# Patient Record
Sex: Female | Born: 1950 | Race: Black or African American | Hispanic: No | Marital: Married
Health system: Southern US, Community
[De-identification: ages and names within clinical notes are randomized; demographics above are authoritative.]

## PROBLEM LIST (undated history)

## (undated) DIAGNOSIS — I10 Essential (primary) hypertension: Secondary | ICD-10-CM

## (undated) DIAGNOSIS — E785 Hyperlipidemia, unspecified: Secondary | ICD-10-CM

## (undated) HISTORY — PX: REDUCTION MAMMAPLASTY: SUR839

## (undated) HISTORY — DX: Hyperlipidemia, unspecified: E78.5

## (undated) HISTORY — DX: Essential (primary) hypertension: I10

---

## 1997-12-26 ENCOUNTER — Ambulatory Visit (HOSPITAL_COMMUNITY): Admission: RE | Admit: 1997-12-26 | Discharge: 1997-12-26 | Payer: Self-pay | Admitting: Family Medicine

## 1998-02-27 ENCOUNTER — Ambulatory Visit: Admission: RE | Admit: 1998-02-27 | Discharge: 1998-02-27 | Payer: Self-pay | Admitting: *Deleted

## 1999-03-25 ENCOUNTER — Encounter: Admission: RE | Admit: 1999-03-25 | Discharge: 1999-03-25 | Payer: Self-pay | Admitting: Family Medicine

## 1999-03-25 ENCOUNTER — Encounter: Payer: Self-pay | Admitting: Family Medicine

## 1999-04-09 ENCOUNTER — Encounter: Admission: RE | Admit: 1999-04-09 | Discharge: 1999-04-09 | Payer: Self-pay | Admitting: Family Medicine

## 1999-04-09 ENCOUNTER — Encounter: Payer: Self-pay | Admitting: Family Medicine

## 1999-10-01 ENCOUNTER — Encounter: Payer: Self-pay | Admitting: Family Medicine

## 1999-10-01 ENCOUNTER — Encounter: Admission: RE | Admit: 1999-10-01 | Discharge: 1999-10-01 | Payer: Self-pay | Admitting: Family Medicine

## 2000-02-22 ENCOUNTER — Other Ambulatory Visit: Admission: RE | Admit: 2000-02-22 | Discharge: 2000-02-22 | Payer: Self-pay | Admitting: Family Medicine

## 2000-04-28 ENCOUNTER — Encounter: Admission: RE | Admit: 2000-04-28 | Discharge: 2000-04-28 | Payer: Self-pay | Admitting: Family Medicine

## 2000-04-28 ENCOUNTER — Encounter: Payer: Self-pay | Admitting: Family Medicine

## 2001-02-22 ENCOUNTER — Other Ambulatory Visit: Admission: RE | Admit: 2001-02-22 | Discharge: 2001-02-22 | Payer: Self-pay | Admitting: Family Medicine

## 2001-05-01 ENCOUNTER — Encounter: Admission: RE | Admit: 2001-05-01 | Discharge: 2001-05-01 | Payer: Self-pay | Admitting: Family Medicine

## 2001-05-01 ENCOUNTER — Encounter: Payer: Self-pay | Admitting: Family Medicine

## 2001-06-05 ENCOUNTER — Ambulatory Visit (HOSPITAL_COMMUNITY): Admission: RE | Admit: 2001-06-05 | Discharge: 2001-06-05 | Payer: Self-pay | Admitting: Obstetrics and Gynecology

## 2001-06-05 ENCOUNTER — Encounter: Payer: Self-pay | Admitting: Obstetrics and Gynecology

## 2001-06-22 ENCOUNTER — Ambulatory Visit (HOSPITAL_COMMUNITY): Admission: RE | Admit: 2001-06-22 | Discharge: 2001-06-22 | Payer: Self-pay | Admitting: Obstetrics and Gynecology

## 2001-06-22 ENCOUNTER — Encounter: Payer: Self-pay | Admitting: Obstetrics and Gynecology

## 2002-04-09 ENCOUNTER — Other Ambulatory Visit: Admission: RE | Admit: 2002-04-09 | Discharge: 2002-04-09 | Payer: Self-pay | Admitting: Family Medicine

## 2002-05-03 ENCOUNTER — Encounter: Payer: Self-pay | Admitting: Family Medicine

## 2002-05-03 ENCOUNTER — Encounter: Admission: RE | Admit: 2002-05-03 | Discharge: 2002-05-03 | Payer: Self-pay | Admitting: Family Medicine

## 2003-04-18 ENCOUNTER — Other Ambulatory Visit: Admission: RE | Admit: 2003-04-18 | Discharge: 2003-04-18 | Payer: Self-pay | Admitting: Family Medicine

## 2003-04-29 ENCOUNTER — Encounter: Admission: RE | Admit: 2003-04-29 | Discharge: 2003-04-29 | Payer: Self-pay | Admitting: Family Medicine

## 2003-06-26 ENCOUNTER — Encounter: Admission: RE | Admit: 2003-06-26 | Discharge: 2003-06-26 | Payer: Self-pay | Admitting: Family Medicine

## 2003-12-29 ENCOUNTER — Emergency Department (HOSPITAL_COMMUNITY): Admission: EM | Admit: 2003-12-29 | Discharge: 2003-12-29 | Payer: Self-pay | Admitting: Emergency Medicine

## 2004-07-02 ENCOUNTER — Encounter: Admission: RE | Admit: 2004-07-02 | Discharge: 2004-07-02 | Payer: Self-pay | Admitting: Family Medicine

## 2004-07-02 ENCOUNTER — Other Ambulatory Visit: Admission: RE | Admit: 2004-07-02 | Discharge: 2004-07-02 | Payer: Self-pay | Admitting: Family Medicine

## 2004-11-05 IMAGING — US US TRANSVAGINAL NON-OB
1 series · 14 of 25 positions shown · non-contrast
Comparison: none

CLINICAL DATA: Follow up known uterine fibroid. 
 TRANSVAGINAL/TRANSABDOMINAL PELVIC ULTRASOUND
 Comparison is made with [HOSPITAL] pelvic ultrasound of 06/05/01.  Since that study progressive diffuse uterine fibroids are seen.  The largest is partially exophytic at the posterior mid uterine segment which transabdominally measures 5.9cm AP x 7.3cm wide x 8.9cm long.  Part of the fundal endometrial stripe is visualized and measures normally at 4mm.  The remaining endometrium is poorly visualized due to diffuse fibroid changes.  The uterus is enlarged and lobulated measuring 15cm long x 10cm AP x 9.1cm wide due to multiple fibroids.  The bilateral ovaries are not identified although no sonographic adnexal masses are seen.  No free fluid is noted.  
 IMPRESSION
   Enlarged      lobulated uterus with progressive multiple uterine fibroids since previous      [HOSPITAL] pelvic ultrasound of 06/05/01 as described. 
  Non      visualization of bilateral ovaries. 
  Otherwise      negative.

[Series 1: unknown · 0.38mm/px · 14 of 41 slices shown]
[im 1/41]
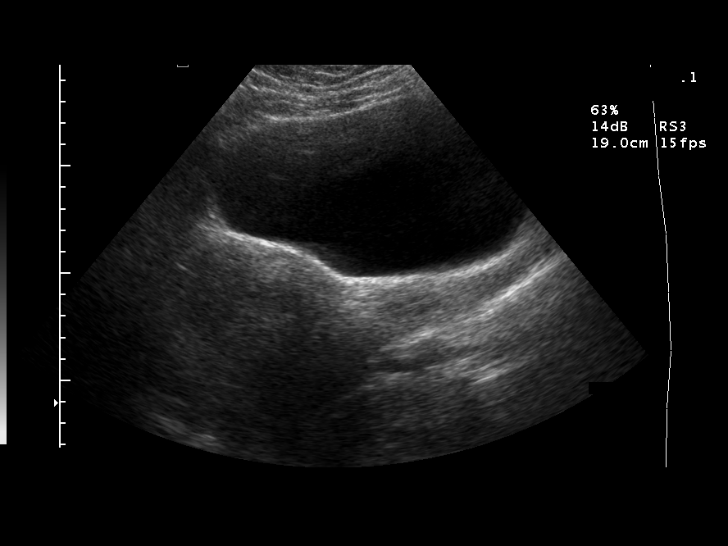
[im 4/41]
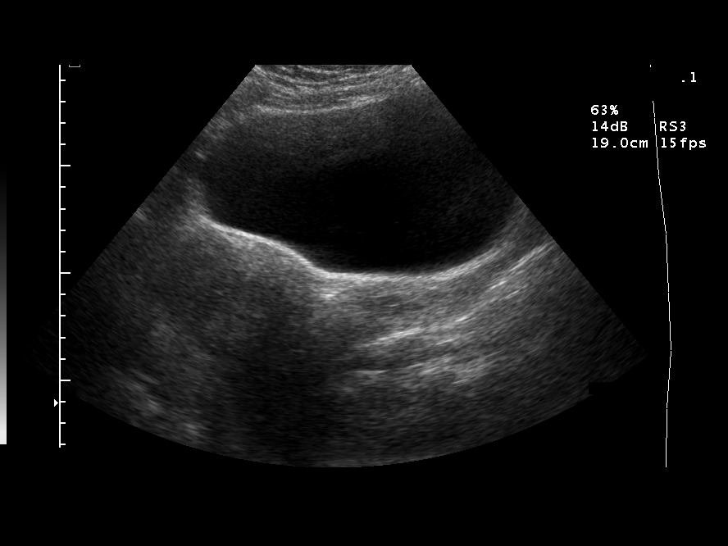
[im 7/41]
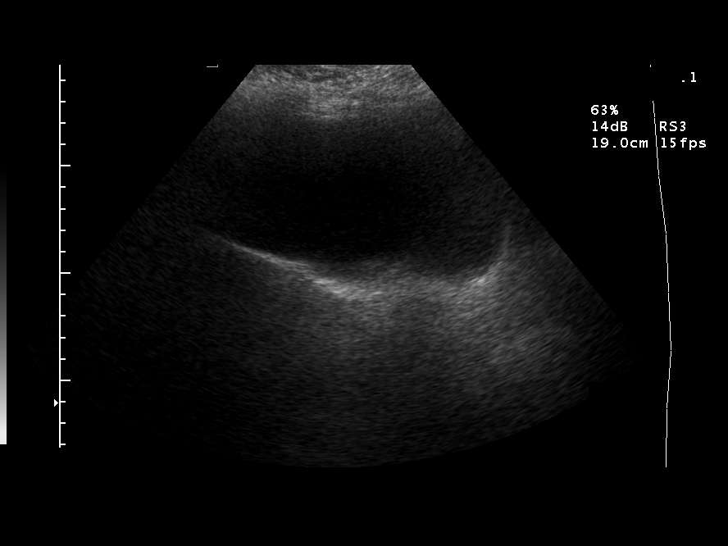
[im 11/41]
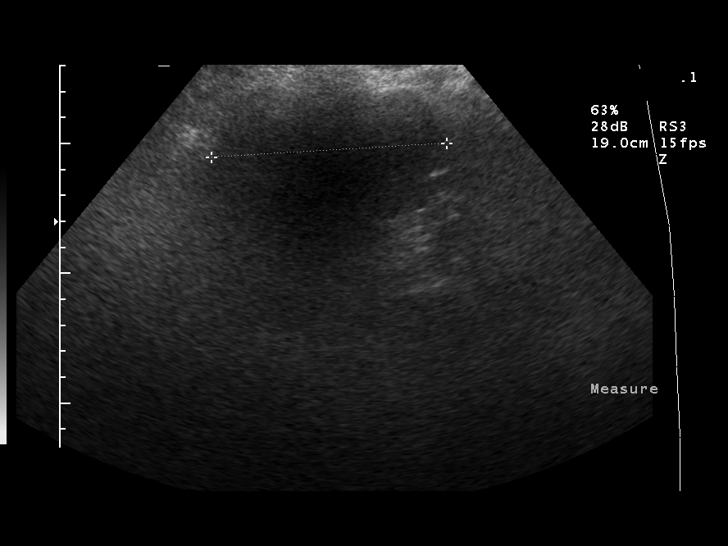
[im 14/41]
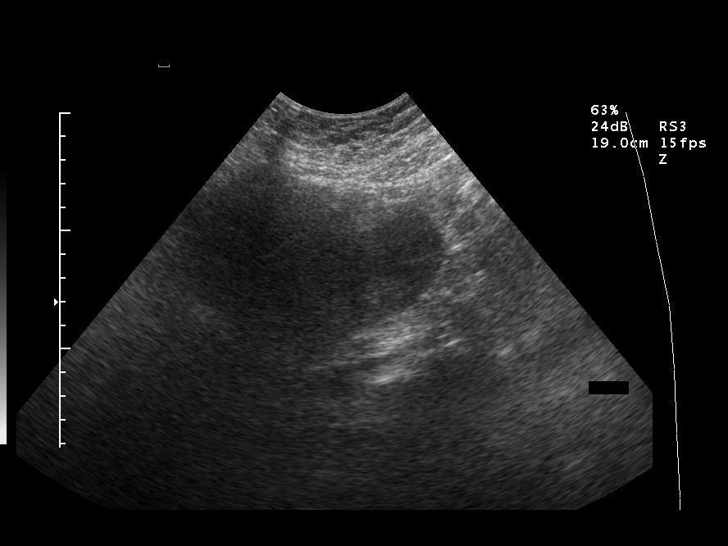
[im 16/41]
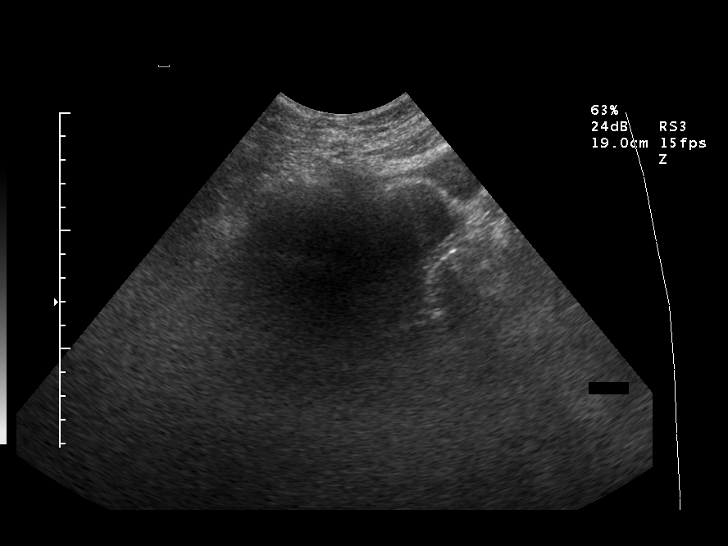
[im 19/41]
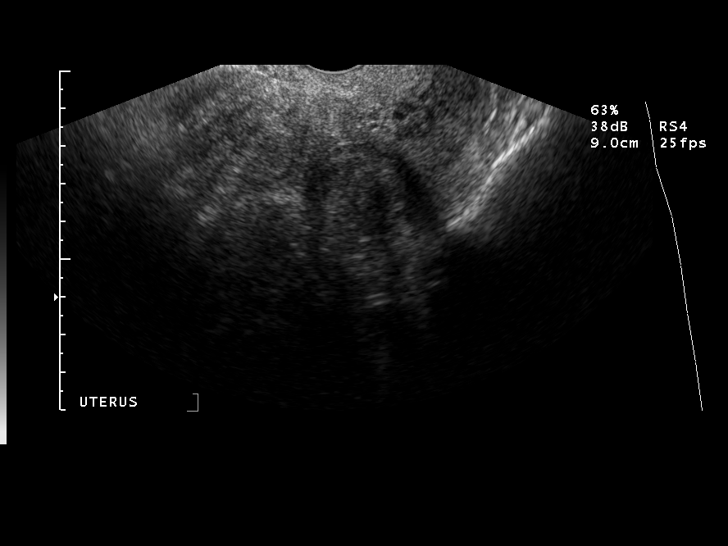
[im 22/41]
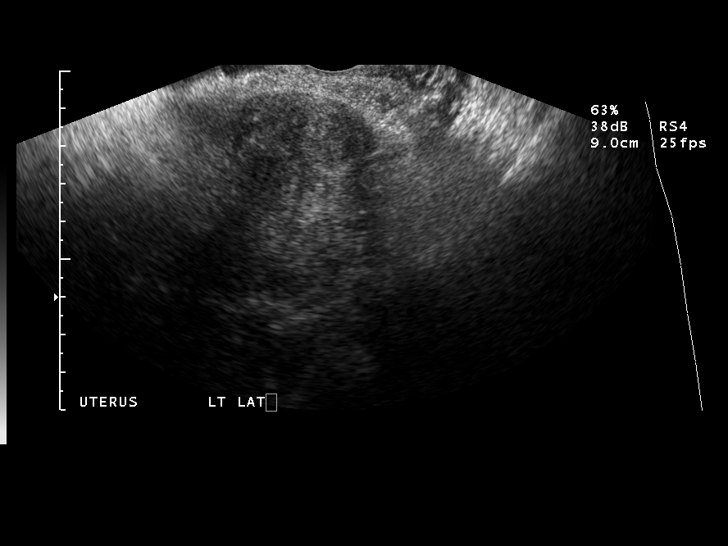
[im 26/41]
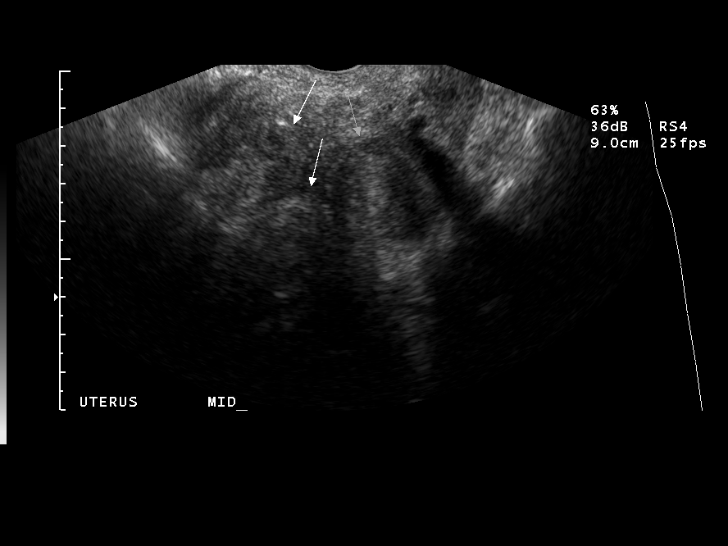
[im 27/41]
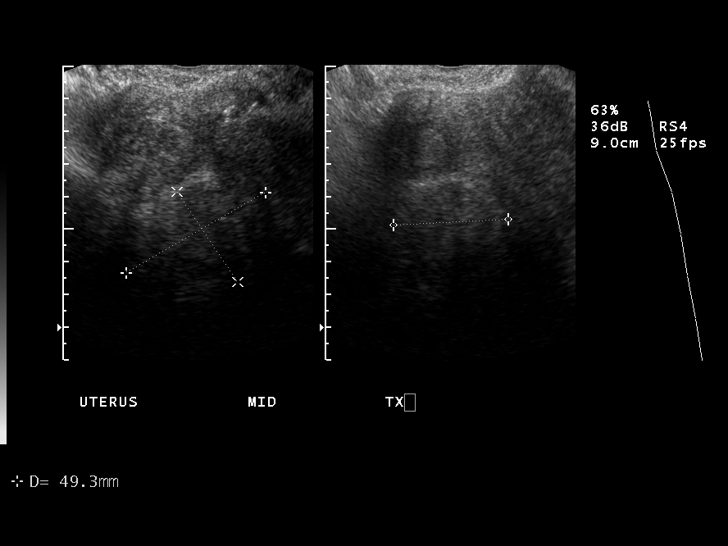
[im 31/41]
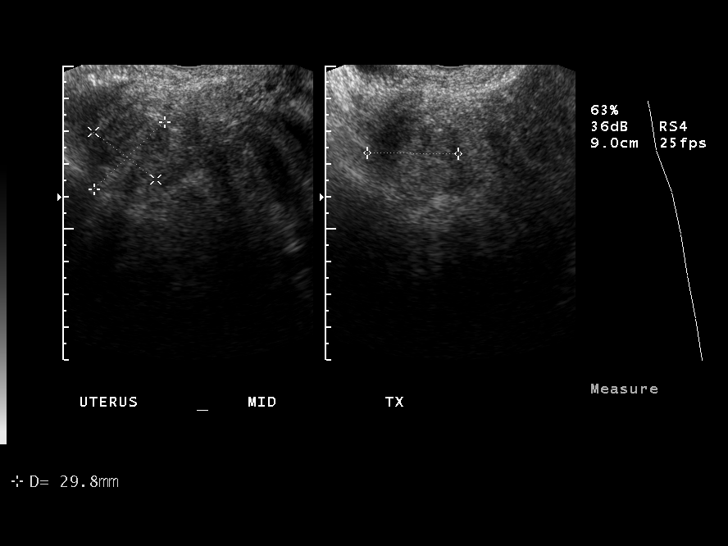
[im 34/41]
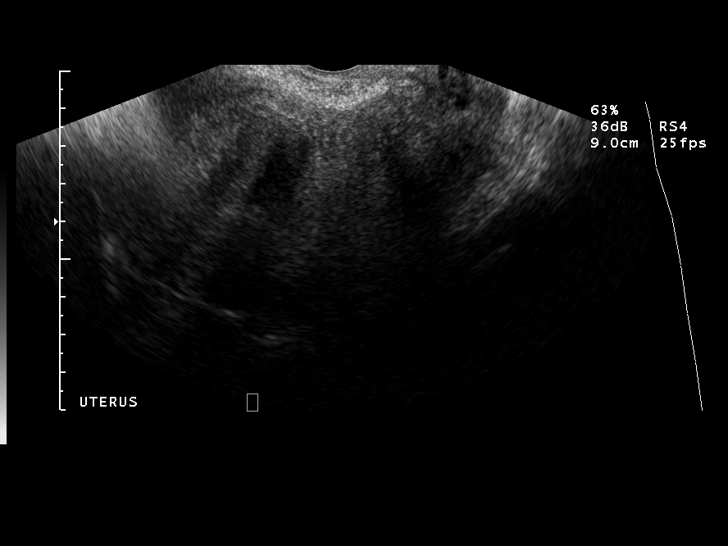
[im 37/41]
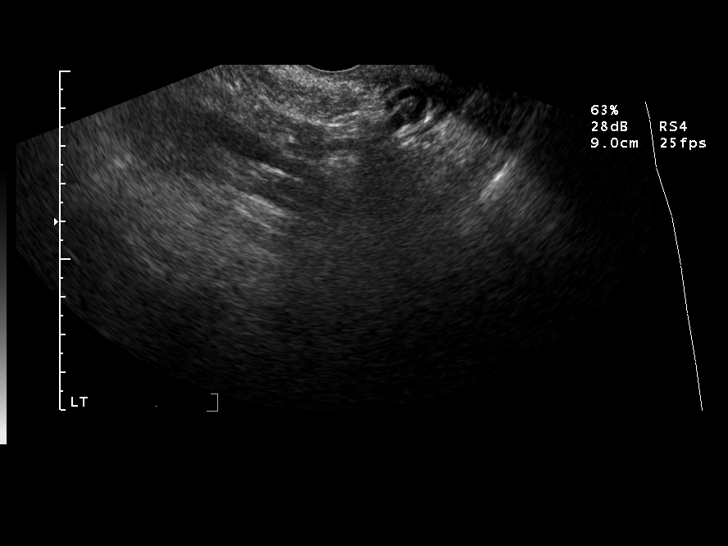
[im 41/41]
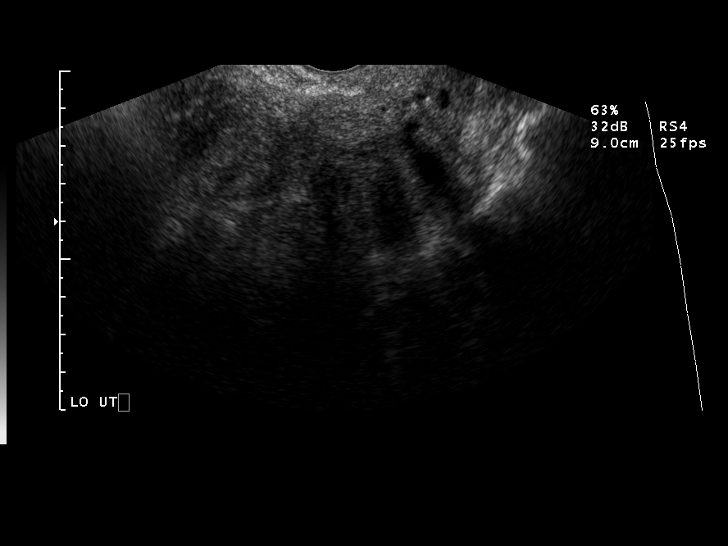

[14 of 25 positions shown; findings below may reference images not displayed]

## 2005-07-25 ENCOUNTER — Encounter: Admission: RE | Admit: 2005-07-25 | Discharge: 2005-07-25 | Payer: Self-pay | Admitting: Family Medicine

## 2005-11-04 ENCOUNTER — Other Ambulatory Visit: Admission: RE | Admit: 2005-11-04 | Discharge: 2005-11-04 | Payer: Self-pay | Admitting: Family Medicine

## 2006-08-16 ENCOUNTER — Encounter: Admission: RE | Admit: 2006-08-16 | Discharge: 2006-08-16 | Payer: Self-pay | Admitting: Family Medicine

## 2007-07-11 ENCOUNTER — Encounter: Admission: RE | Admit: 2007-07-11 | Discharge: 2007-07-11 | Payer: Self-pay | Admitting: Family Medicine

## 2007-08-17 ENCOUNTER — Encounter: Admission: RE | Admit: 2007-08-17 | Discharge: 2007-08-17 | Payer: Self-pay | Admitting: Family Medicine

## 2007-09-04 ENCOUNTER — Encounter: Admission: RE | Admit: 2007-09-04 | Discharge: 2007-09-04 | Payer: Self-pay | Admitting: Family Medicine

## 2007-09-04 ENCOUNTER — Other Ambulatory Visit: Admission: RE | Admit: 2007-09-04 | Discharge: 2007-09-04 | Payer: Self-pay | Admitting: Family Medicine

## 2008-08-18 ENCOUNTER — Encounter: Admission: RE | Admit: 2008-08-18 | Discharge: 2008-08-18 | Payer: Self-pay | Admitting: Family Medicine

## 2008-10-06 ENCOUNTER — Other Ambulatory Visit: Admission: RE | Admit: 2008-10-06 | Discharge: 2008-10-06 | Payer: Self-pay | Admitting: Family Medicine

## 2009-08-19 ENCOUNTER — Encounter: Admission: RE | Admit: 2009-08-19 | Discharge: 2009-08-19 | Payer: Self-pay | Admitting: Family Medicine

## 2010-04-05 ENCOUNTER — Other Ambulatory Visit: Admission: RE | Admit: 2010-04-05 | Discharge: 2010-04-05 | Payer: Self-pay | Admitting: Family Medicine

## 2010-07-19 ENCOUNTER — Other Ambulatory Visit: Payer: Self-pay | Admitting: Family Medicine

## 2010-07-19 DIAGNOSIS — Z1231 Encounter for screening mammogram for malignant neoplasm of breast: Secondary | ICD-10-CM

## 2010-08-23 ENCOUNTER — Ambulatory Visit
Admission: RE | Admit: 2010-08-23 | Discharge: 2010-08-23 | Disposition: A | Discharge status: Home / Self Care | Payer: Private Health Insurance - Indemnity | Source: Ambulatory Visit | Attending: Family Medicine | Admitting: Family Medicine

## 2010-08-23 DIAGNOSIS — Z1231 Encounter for screening mammogram for malignant neoplasm of breast: Secondary | ICD-10-CM

## 2011-08-11 ENCOUNTER — Other Ambulatory Visit: Payer: Self-pay | Admitting: Family Medicine

## 2011-08-11 DIAGNOSIS — Z1231 Encounter for screening mammogram for malignant neoplasm of breast: Secondary | ICD-10-CM

## 2011-08-24 ENCOUNTER — Ambulatory Visit
Admission: RE | Admit: 2011-08-24 | Discharge: 2011-08-24 | Disposition: A | Discharge status: Home / Self Care | Payer: Private Health Insurance - Indemnity | Source: Ambulatory Visit | Attending: Family Medicine | Admitting: Family Medicine

## 2011-08-24 DIAGNOSIS — Z1231 Encounter for screening mammogram for malignant neoplasm of breast: Secondary | ICD-10-CM

## 2012-07-20 ENCOUNTER — Other Ambulatory Visit: Payer: Self-pay

## 2012-07-20 DIAGNOSIS — Z1231 Encounter for screening mammogram for malignant neoplasm of breast: Secondary | ICD-10-CM

## 2012-08-24 ENCOUNTER — Ambulatory Visit
Admission: RE | Admit: 2012-08-24 | Discharge: 2012-08-24 | Disposition: A | Discharge status: Home / Self Care | Payer: Private Health Insurance - Indemnity | Source: Ambulatory Visit

## 2012-08-24 DIAGNOSIS — Z1231 Encounter for screening mammogram for malignant neoplasm of breast: Secondary | ICD-10-CM

## 2013-07-25 ENCOUNTER — Other Ambulatory Visit: Payer: Self-pay

## 2013-07-25 DIAGNOSIS — Z1231 Encounter for screening mammogram for malignant neoplasm of breast: Secondary | ICD-10-CM

## 2013-07-25 DIAGNOSIS — Z9889 Other specified postprocedural states: Secondary | ICD-10-CM

## 2013-08-26 ENCOUNTER — Ambulatory Visit
Admission: RE | Admit: 2013-08-26 | Discharge: 2013-08-26 | Disposition: A | Discharge status: Home / Self Care | Payer: Commercial Managed Care - HMO | Source: Ambulatory Visit

## 2013-08-26 DIAGNOSIS — Z9889 Other specified postprocedural states: Secondary | ICD-10-CM

## 2013-08-26 DIAGNOSIS — Z1231 Encounter for screening mammogram for malignant neoplasm of breast: Secondary | ICD-10-CM

## 2014-07-23 ENCOUNTER — Other Ambulatory Visit: Payer: Self-pay

## 2014-07-23 DIAGNOSIS — Z1231 Encounter for screening mammogram for malignant neoplasm of breast: Secondary | ICD-10-CM

## 2014-08-29 ENCOUNTER — Ambulatory Visit
Admission: RE | Admit: 2014-08-29 | Discharge: 2014-08-29 | Disposition: A | Discharge status: Home / Self Care | Payer: Commercial Managed Care - HMO | Source: Ambulatory Visit

## 2014-08-29 DIAGNOSIS — Z1231 Encounter for screening mammogram for malignant neoplasm of breast: Secondary | ICD-10-CM

## 2015-07-23 ENCOUNTER — Other Ambulatory Visit: Payer: Self-pay

## 2015-07-23 DIAGNOSIS — Z1231 Encounter for screening mammogram for malignant neoplasm of breast: Secondary | ICD-10-CM

## 2015-07-23 DIAGNOSIS — Z9889 Other specified postprocedural states: Secondary | ICD-10-CM

## 2015-09-01 ENCOUNTER — Ambulatory Visit
Admission: RE | Admit: 2015-09-01 | Discharge: 2015-09-01 | Disposition: A | Discharge status: Home / Self Care | Payer: Commercial Managed Care - HMO | Source: Ambulatory Visit

## 2015-09-01 DIAGNOSIS — Z1231 Encounter for screening mammogram for malignant neoplasm of breast: Secondary | ICD-10-CM

## 2015-09-01 DIAGNOSIS — Z9889 Other specified postprocedural states: Secondary | ICD-10-CM

## 2016-03-01 ENCOUNTER — Encounter: Payer: Self-pay | Admitting: Skilled Nursing Facility1

## 2016-03-01 ENCOUNTER — Encounter: Payer: Commercial Managed Care - HMO | Attending: Family Medicine | Admitting: Skilled Nursing Facility1

## 2016-03-01 DIAGNOSIS — Z6839 Body mass index (BMI) 39.0-39.9, adult: Secondary | ICD-10-CM | POA: Diagnosis not present

## 2016-03-01 DIAGNOSIS — E119 Type 2 diabetes mellitus without complications: Secondary | ICD-10-CM

## 2016-03-01 DIAGNOSIS — Z713 Dietary counseling and surveillance: Secondary | ICD-10-CM | POA: Insufficient documentation

## 2016-03-01 NOTE — Progress Notes (Signed)

## 2016-03-08 ENCOUNTER — Encounter: Payer: Commercial Managed Care - HMO | Admitting: Dietician

## 2016-03-08 DIAGNOSIS — E119 Type 2 diabetes mellitus without complications: Secondary | ICD-10-CM | POA: Diagnosis not present

## 2016-03-08 NOTE — Progress Notes (Signed)

## 2016-03-15 ENCOUNTER — Encounter: Payer: Self-pay | Admitting: Skilled Nursing Facility1

## 2016-03-15 ENCOUNTER — Encounter: Payer: Commercial Managed Care - HMO | Admitting: Skilled Nursing Facility1

## 2016-03-15 DIAGNOSIS — E119 Type 2 diabetes mellitus without complications: Secondary | ICD-10-CM

## 2016-03-15 NOTE — Progress Notes (Signed)
Patient was seen on 03/15/2016 for the third of a series of three diabetes self-management courses at the [facility]. The following learning objectives were met by the patient during this class:  . State the amount of activity recommended for healthy living . Describe activities suitable for individual needs . Identify ways to regularly incorporate activity into daily life . Identify barriers to activity and ways to over come these barriers  Identify diabetes medications being personally used and their primary action for lowering glucose and possible side effects . Describe role of stress on blood glucose and develop strategies to address psychosocial issues . Identify diabetes complications and ways to prevent them  Explain how to manage diabetes during illness . Evaluate success in meeting personal goal . Establish 2-3 goals that they will plan to diligently work on until they return for the  19-monthfollow-up visit  Goals:   I will count my carb choices at most meals and snacks  I will be active 150 minutes or more 3 times a week  I will take my diabetes medications as scheduled  Your patient has identified these potential barriers to change:  Motivation  Your patient has identified their diabetes self-care support plan as  [facility]  Attend Monthly Diabetes Support Group as needed or make a future follow up appointment

## 2016-08-05 ENCOUNTER — Other Ambulatory Visit: Payer: Self-pay | Admitting: Family Medicine

## 2016-08-05 DIAGNOSIS — Z1231 Encounter for screening mammogram for malignant neoplasm of breast: Secondary | ICD-10-CM

## 2016-09-01 ENCOUNTER — Ambulatory Visit
Admission: RE | Admit: 2016-09-01 | Discharge: 2016-09-01 | Disposition: A | Discharge status: Home / Self Care | Payer: Medicare Other | Source: Ambulatory Visit | Attending: Family Medicine | Admitting: Family Medicine

## 2016-09-01 DIAGNOSIS — Z1231 Encounter for screening mammogram for malignant neoplasm of breast: Secondary | ICD-10-CM

## 2016-11-03 ENCOUNTER — Ambulatory Visit
Admission: RE | Admit: 2016-11-03 | Discharge: 2016-11-03 | Disposition: A | Discharge status: Home / Self Care | Payer: Medicare Other | Source: Ambulatory Visit | Attending: Physician Assistant | Admitting: Physician Assistant

## 2016-11-03 ENCOUNTER — Other Ambulatory Visit: Payer: Self-pay | Admitting: Physician Assistant

## 2016-11-03 DIAGNOSIS — R0602 Shortness of breath: Secondary | ICD-10-CM

## 2016-11-07 ENCOUNTER — Other Ambulatory Visit: Payer: Self-pay | Admitting: Physician Assistant

## 2016-11-07 DIAGNOSIS — R600 Localized edema: Secondary | ICD-10-CM

## 2016-11-07 DIAGNOSIS — R0602 Shortness of breath: Secondary | ICD-10-CM

## 2016-11-17 ENCOUNTER — Ambulatory Visit (HOSPITAL_COMMUNITY): Payer: Medicare Other | Attending: Cardiology

## 2016-11-17 ENCOUNTER — Other Ambulatory Visit: Payer: Self-pay

## 2016-11-17 DIAGNOSIS — R0602 Shortness of breath: Secondary | ICD-10-CM | POA: Diagnosis not present

## 2016-11-17 DIAGNOSIS — R600 Localized edema: Secondary | ICD-10-CM | POA: Insufficient documentation

## 2016-11-17 DIAGNOSIS — I081 Rheumatic disorders of both mitral and tricuspid valves: Secondary | ICD-10-CM | POA: Diagnosis not present

## 2017-07-24 ENCOUNTER — Other Ambulatory Visit: Payer: Self-pay | Admitting: Family Medicine

## 2017-07-24 DIAGNOSIS — Z1231 Encounter for screening mammogram for malignant neoplasm of breast: Secondary | ICD-10-CM

## 2017-09-04 ENCOUNTER — Ambulatory Visit
Admission: RE | Admit: 2017-09-04 | Discharge: 2017-09-04 | Disposition: A | Discharge status: Home / Self Care | Payer: Medicare Other | Source: Ambulatory Visit | Attending: Family Medicine | Admitting: Family Medicine

## 2017-09-04 DIAGNOSIS — Z1231 Encounter for screening mammogram for malignant neoplasm of breast: Secondary | ICD-10-CM

## 2017-09-26 ENCOUNTER — Other Ambulatory Visit: Payer: Self-pay | Admitting: Family Medicine

## 2017-09-26 DIAGNOSIS — I739 Peripheral vascular disease, unspecified: Secondary | ICD-10-CM

## 2017-09-28 ENCOUNTER — Other Ambulatory Visit: Payer: Medicare Other

## 2017-09-29 ENCOUNTER — Ambulatory Visit
Admission: RE | Admit: 2017-09-29 | Discharge: 2017-09-29 | Disposition: A | Discharge status: Home / Self Care | Payer: Medicare Other | Source: Ambulatory Visit | Attending: Family Medicine | Admitting: Family Medicine

## 2017-09-29 DIAGNOSIS — I739 Peripheral vascular disease, unspecified: Secondary | ICD-10-CM

## 2018-07-31 ENCOUNTER — Other Ambulatory Visit: Payer: Self-pay | Admitting: Family Medicine

## 2018-07-31 DIAGNOSIS — Z1231 Encounter for screening mammogram for malignant neoplasm of breast: Secondary | ICD-10-CM

## 2018-09-06 ENCOUNTER — Ambulatory Visit: Payer: Medicare Other

## 2018-11-08 ENCOUNTER — Other Ambulatory Visit: Payer: Self-pay

## 2018-11-08 ENCOUNTER — Ambulatory Visit
Admission: RE | Admit: 2018-11-08 | Discharge: 2018-11-08 | Disposition: A | Discharge status: Home / Self Care | Payer: Medicare Other | Source: Ambulatory Visit | Attending: Family Medicine | Admitting: Family Medicine

## 2018-11-08 DIAGNOSIS — Z1231 Encounter for screening mammogram for malignant neoplasm of breast: Secondary | ICD-10-CM

## 2019-07-04 ENCOUNTER — Ambulatory Visit: Payer: Medicare Other | Attending: Internal Medicine

## 2019-07-04 DIAGNOSIS — Z23 Encounter for immunization: Secondary | ICD-10-CM | POA: Insufficient documentation

## 2019-07-04 NOTE — Progress Notes (Signed)
   Covid-19 Vaccination Clinic  Name:  Nicole Shields    MRN: [id number] DOB: 02/16/1951  07/04/2019  [name] was observed post Covid-19 immunization for 15 minutes without incidence. She was provided with Vaccine Information Sheet and instruction to access the V-Safe system.   [name] was instructed to call 911 with any severe reactions post vaccine: Marland Kitchen Difficulty breathing  . Swelling of your face and throat  . A fast heartbeat  . A bad rash all over your body  . Dizziness and weakness    Immunizations Administered    Name Date Dose VIS Date Route   Pfizer COVID-19 Vaccine 07/04/2019 10:01 AM 0.3 mL 05/03/2019 Intramuscular   Manufacturer: [employer], [employer]   Lot: [id number]   NDC: [address]-0

## 2019-07-27 ENCOUNTER — Ambulatory Visit: Payer: Medicare Other | Attending: Internal Medicine

## 2019-07-27 DIAGNOSIS — Z23 Encounter for immunization: Secondary | ICD-10-CM | POA: Insufficient documentation

## 2019-07-27 NOTE — Progress Notes (Signed)
   Covid-19 Vaccination Clinic  Name:  Nicole Shields    MRN: [id number] DOB: 11/02/1950  07/27/2019  [name] was observed post Covid-19 immunization for 15 minutes without incident. She was provided with Vaccine Information Sheet and instruction to access the V-Safe system.   [name] was instructed to call 911 with any severe reactions post vaccine: Marland Kitchen Difficulty breathing  . Swelling of face and throat  . A fast heartbeat  . A bad rash all over body  . Dizziness and weakness   Immunizations Administered    Name Date Dose VIS Date Route   Pfizer COVID-19 Vaccine 07/27/2019  9:50 AM 0.3 mL 05/03/2019 Intramuscular   Manufacturer: [employer], [employer]   Lot: [id number]   NDC: [address]-0

## 2019-10-16 ENCOUNTER — Other Ambulatory Visit: Payer: Self-pay | Admitting: Family Medicine

## 2019-10-16 DIAGNOSIS — Z1231 Encounter for screening mammogram for malignant neoplasm of breast: Secondary | ICD-10-CM

## 2019-11-12 ENCOUNTER — Ambulatory Visit
Admission: RE | Admit: 2019-11-12 | Discharge: 2019-11-12 | Disposition: A | Discharge status: Home / Self Care | Payer: Medicare Other | Source: Ambulatory Visit | Attending: Family Medicine | Admitting: Family Medicine

## 2019-11-12 ENCOUNTER — Other Ambulatory Visit: Payer: Self-pay

## 2019-11-12 DIAGNOSIS — Z1231 Encounter for screening mammogram for malignant neoplasm of breast: Secondary | ICD-10-CM

## 2020-08-07 DIAGNOSIS — E1121 Type 2 diabetes mellitus with diabetic nephropathy: Secondary | ICD-10-CM | POA: Diagnosis not present

## 2020-08-07 DIAGNOSIS — I129 Hypertensive chronic kidney disease with stage 1 through stage 4 chronic kidney disease, or unspecified chronic kidney disease: Secondary | ICD-10-CM | POA: Diagnosis not present

## 2020-08-07 DIAGNOSIS — Z0000 Encounter for general adult medical examination without abnormal findings: Secondary | ICD-10-CM | POA: Diagnosis not present

## 2020-08-07 DIAGNOSIS — Z23 Encounter for immunization: Secondary | ICD-10-CM | POA: Diagnosis not present

## 2020-08-07 DIAGNOSIS — Z1389 Encounter for screening for other disorder: Secondary | ICD-10-CM | POA: Diagnosis not present

## 2020-08-07 DIAGNOSIS — I5030 Unspecified diastolic (congestive) heart failure: Secondary | ICD-10-CM | POA: Diagnosis not present

## 2020-08-07 DIAGNOSIS — N183 Chronic kidney disease, stage 3 unspecified: Secondary | ICD-10-CM | POA: Diagnosis not present

## 2020-08-07 DIAGNOSIS — D649 Anemia, unspecified: Secondary | ICD-10-CM | POA: Diagnosis not present

## 2020-08-07 DIAGNOSIS — E7800 Pure hypercholesterolemia, unspecified: Secondary | ICD-10-CM | POA: Diagnosis not present

## 2020-10-05 ENCOUNTER — Other Ambulatory Visit: Payer: Self-pay | Admitting: Family Medicine

## 2020-10-05 DIAGNOSIS — Z1231 Encounter for screening mammogram for malignant neoplasm of breast: Secondary | ICD-10-CM

## 2020-12-04 ENCOUNTER — Ambulatory Visit
Admission: RE | Admit: 2020-12-04 | Discharge: 2020-12-04 | Disposition: A | Discharge status: Home / Self Care | Payer: Medicare Other | Source: Ambulatory Visit | Attending: Family Medicine | Admitting: Family Medicine

## 2020-12-04 ENCOUNTER — Other Ambulatory Visit: Payer: Self-pay

## 2020-12-04 DIAGNOSIS — Z1231 Encounter for screening mammogram for malignant neoplasm of breast: Secondary | ICD-10-CM

## 2021-02-12 DIAGNOSIS — E1121 Type 2 diabetes mellitus with diabetic nephropathy: Secondary | ICD-10-CM | POA: Diagnosis not present

## 2021-02-12 DIAGNOSIS — E7800 Pure hypercholesterolemia, unspecified: Secondary | ICD-10-CM | POA: Diagnosis not present

## 2021-02-12 DIAGNOSIS — I129 Hypertensive chronic kidney disease with stage 1 through stage 4 chronic kidney disease, or unspecified chronic kidney disease: Secondary | ICD-10-CM | POA: Diagnosis not present

## 2021-02-12 DIAGNOSIS — N183 Chronic kidney disease, stage 3 unspecified: Secondary | ICD-10-CM | POA: Diagnosis not present

## 2021-02-12 DIAGNOSIS — Z23 Encounter for immunization: Secondary | ICD-10-CM | POA: Diagnosis not present

## 2021-05-21 IMAGING — MG DIGITAL SCREENING BILAT W/ TOMO W/ CAD
8 series · 8 of 24 positions shown · non-contrast
Comparison: Previous exam(s).

ACR Breast Density Category a: The breast tissue is almost entirely
fatty.

CLINICAL DATA: Screening.

EXAM:
DIGITAL SCREENING BILATERAL MAMMOGRAM WITH TOMO AND CAD

[L MLO synth-2D]
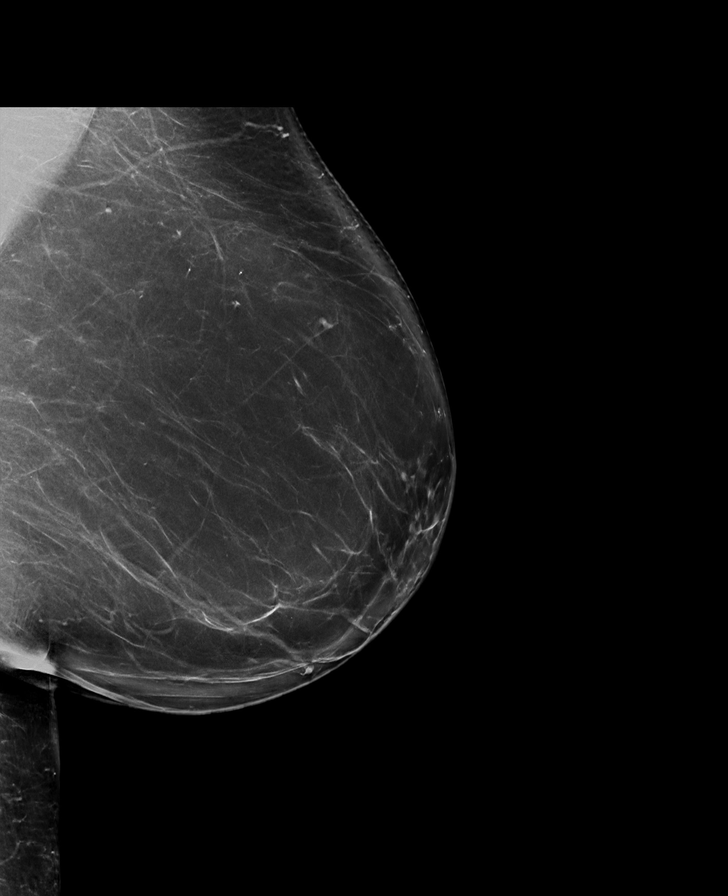

[R CC synth-2D]
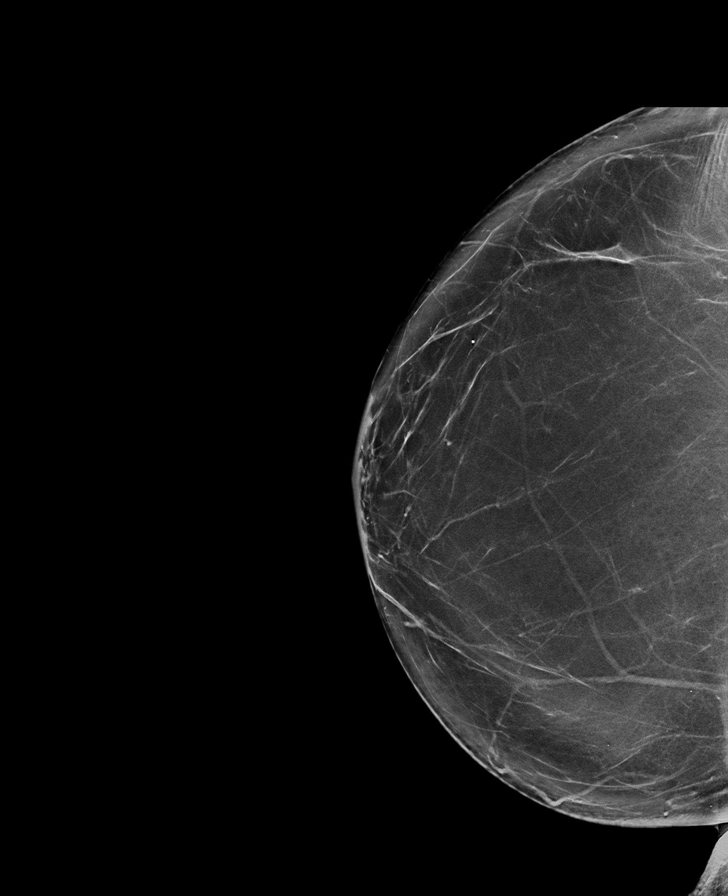

[L CC synth-2D]
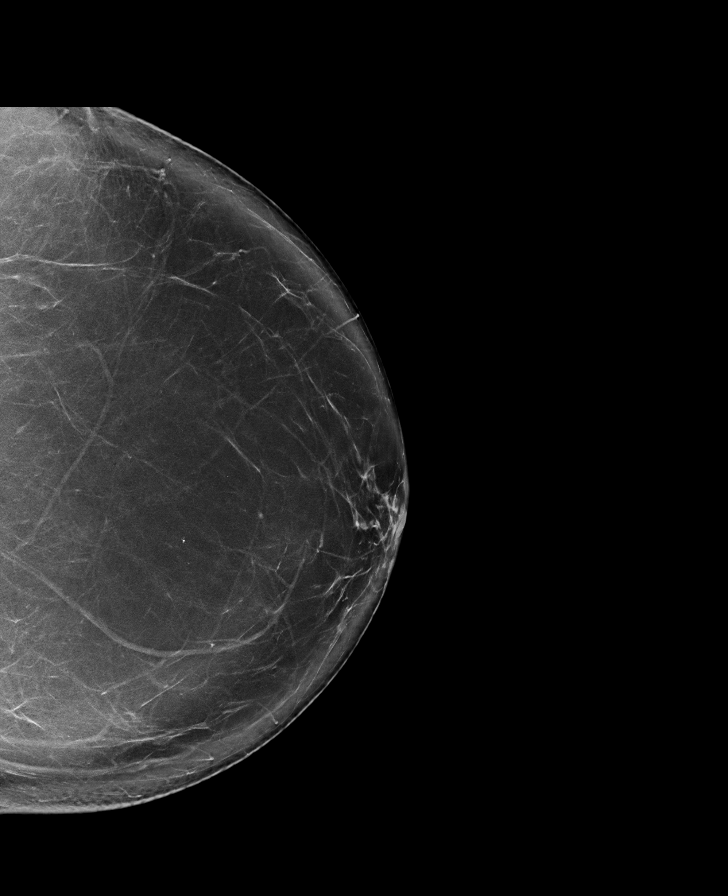

[R MLO synth-2D]
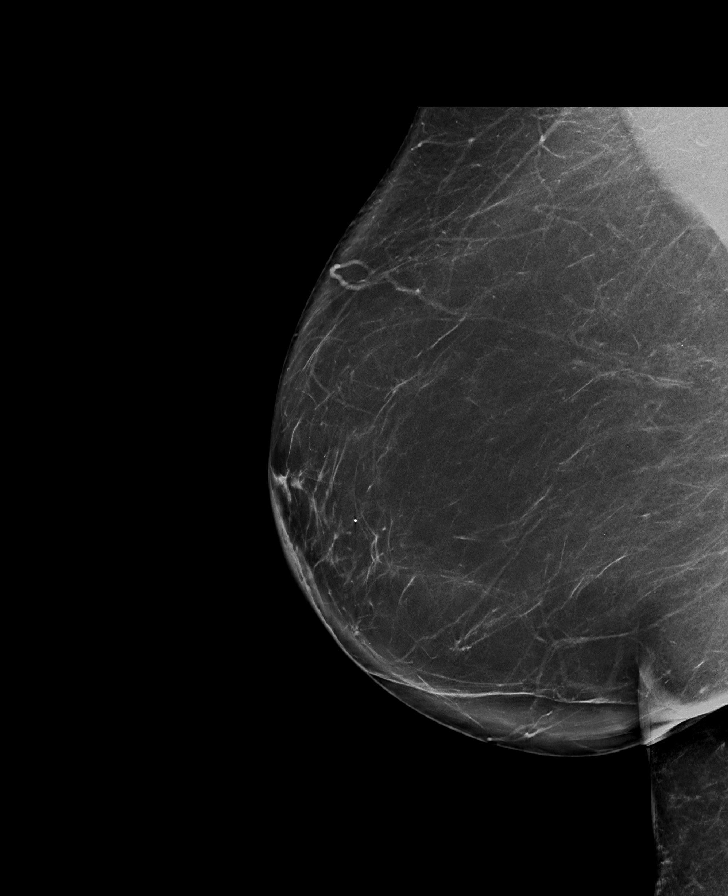

[R MLO tomo · tomo slice 47/93.0]
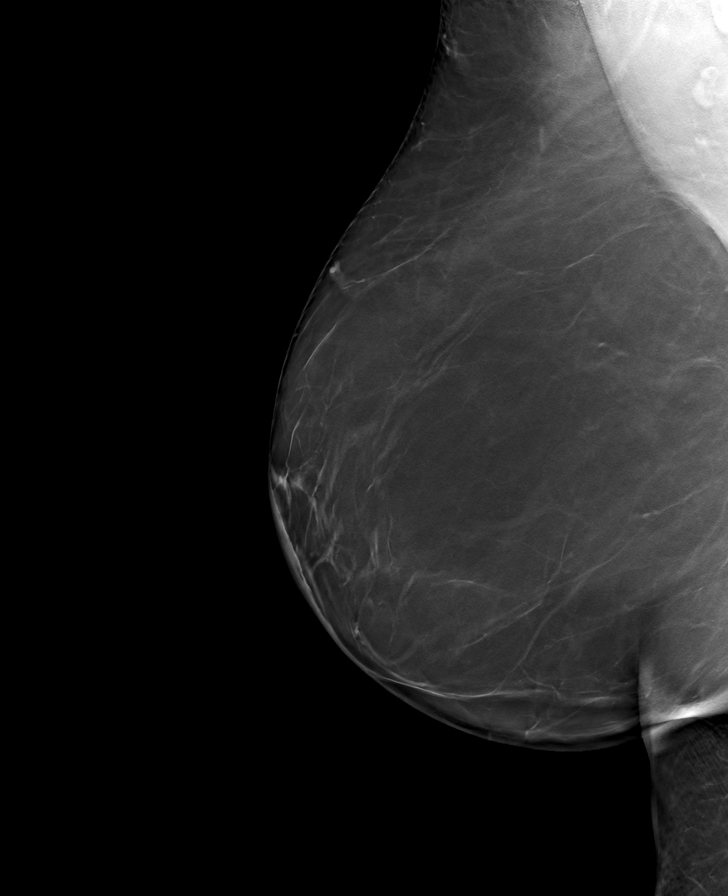

[R CC tomo · tomo slice 44/87.0]
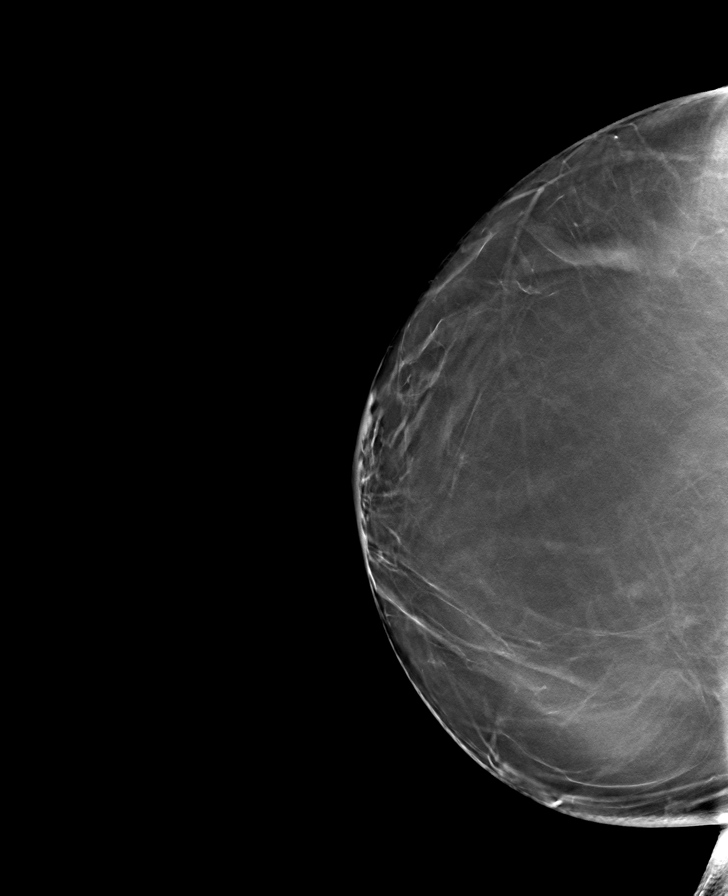

[L CC tomo · tomo slice 46/91.0]
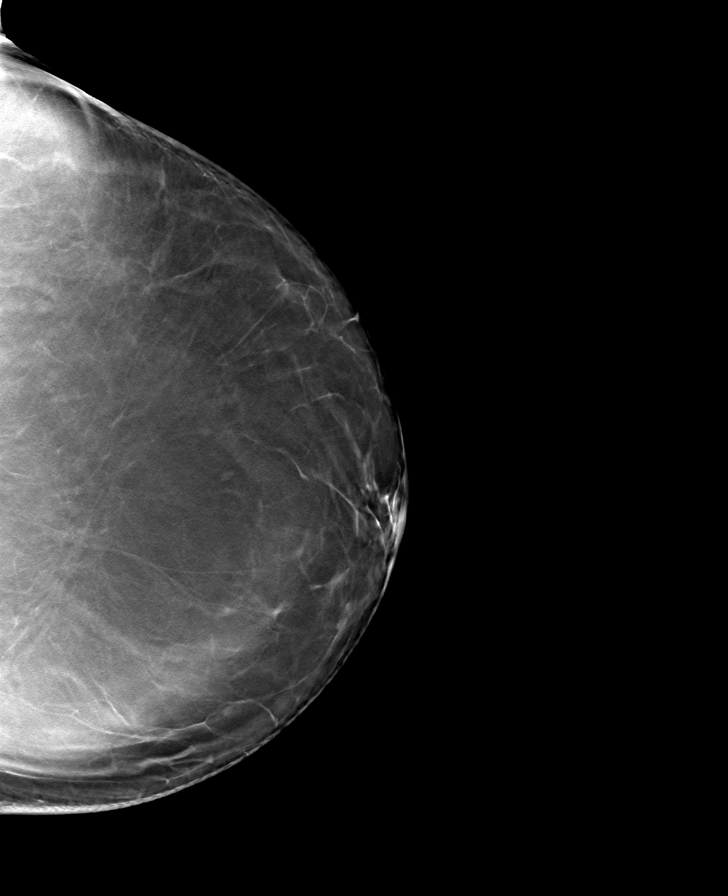

[L MLO tomo · tomo slice 48/95.0]
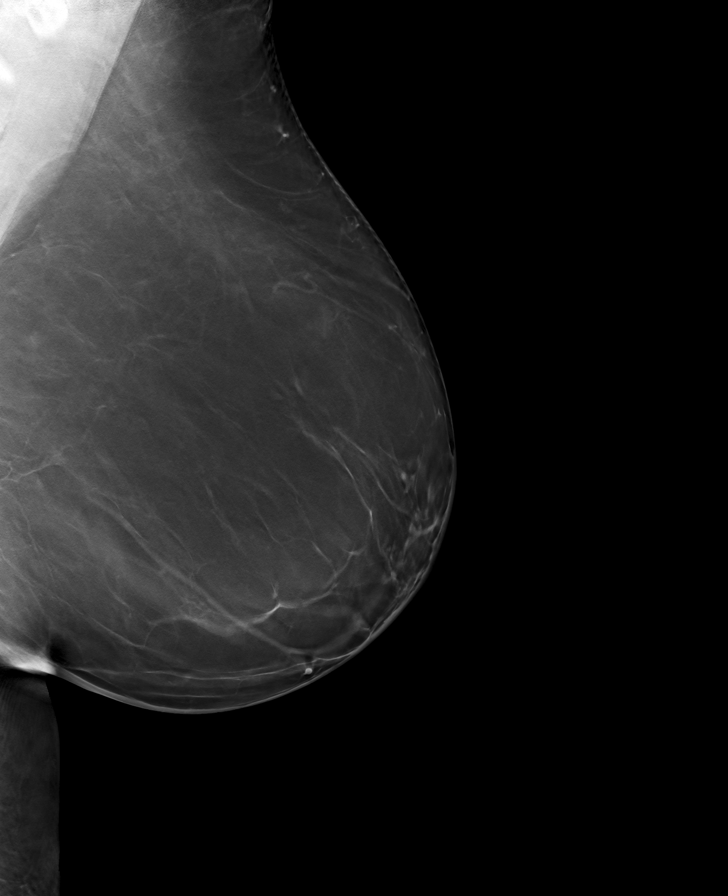

[8 of 24 positions shown; findings below may reference images not displayed]

FINDINGS: There are no findings suspicious for malignancy. Images were
processed with CAD.
IMPRESSION: No mammographic evidence of malignancy. A result letter of this
screening mammogram will be mailed directly to the patient.

RECOMMENDATION:
Screening mammogram in one year. (Code:[id number])

BI-RADS CATEGORY  1: Negative.

## 2021-08-18 DIAGNOSIS — I5030 Unspecified diastolic (congestive) heart failure: Secondary | ICD-10-CM | POA: Diagnosis not present

## 2021-08-18 DIAGNOSIS — Z0000 Encounter for general adult medical examination without abnormal findings: Secondary | ICD-10-CM | POA: Diagnosis not present

## 2021-08-18 DIAGNOSIS — E1121 Type 2 diabetes mellitus with diabetic nephropathy: Secondary | ICD-10-CM | POA: Diagnosis not present

## 2021-08-18 DIAGNOSIS — I129 Hypertensive chronic kidney disease with stage 1 through stage 4 chronic kidney disease, or unspecified chronic kidney disease: Secondary | ICD-10-CM | POA: Diagnosis not present

## 2021-08-18 DIAGNOSIS — Z1389 Encounter for screening for other disorder: Secondary | ICD-10-CM | POA: Diagnosis not present

## 2021-08-18 DIAGNOSIS — D649 Anemia, unspecified: Secondary | ICD-10-CM | POA: Diagnosis not present

## 2021-08-18 DIAGNOSIS — L308 Other specified dermatitis: Secondary | ICD-10-CM | POA: Diagnosis not present

## 2021-08-18 DIAGNOSIS — E7800 Pure hypercholesterolemia, unspecified: Secondary | ICD-10-CM | POA: Diagnosis not present

## 2021-08-18 DIAGNOSIS — N183 Chronic kidney disease, stage 3 unspecified: Secondary | ICD-10-CM | POA: Diagnosis not present

## 2021-10-26 ENCOUNTER — Other Ambulatory Visit: Payer: Self-pay | Admitting: Family Medicine

## 2021-10-26 DIAGNOSIS — Z1231 Encounter for screening mammogram for malignant neoplasm of breast: Secondary | ICD-10-CM

## 2021-12-06 ENCOUNTER — Ambulatory Visit
Admission: RE | Admit: 2021-12-06 | Discharge: 2021-12-06 | Disposition: A | Discharge status: Home / Self Care | Payer: Medicare Other | Source: Ambulatory Visit | Attending: Family Medicine | Admitting: Family Medicine

## 2021-12-06 DIAGNOSIS — Z1231 Encounter for screening mammogram for malignant neoplasm of breast: Secondary | ICD-10-CM

## 2022-02-21 DIAGNOSIS — N183 Chronic kidney disease, stage 3 unspecified: Secondary | ICD-10-CM | POA: Diagnosis not present

## 2022-02-21 DIAGNOSIS — E7800 Pure hypercholesterolemia, unspecified: Secondary | ICD-10-CM | POA: Diagnosis not present

## 2022-02-21 DIAGNOSIS — I5030 Unspecified diastolic (congestive) heart failure: Secondary | ICD-10-CM | POA: Diagnosis not present

## 2022-02-21 DIAGNOSIS — I129 Hypertensive chronic kidney disease with stage 1 through stage 4 chronic kidney disease, or unspecified chronic kidney disease: Secondary | ICD-10-CM | POA: Diagnosis not present

## 2022-02-21 DIAGNOSIS — E1121 Type 2 diabetes mellitus with diabetic nephropathy: Secondary | ICD-10-CM | POA: Diagnosis not present

## 2022-08-24 DIAGNOSIS — I129 Hypertensive chronic kidney disease with stage 1 through stage 4 chronic kidney disease, or unspecified chronic kidney disease: Secondary | ICD-10-CM | POA: Diagnosis not present

## 2022-08-24 DIAGNOSIS — E7800 Pure hypercholesterolemia, unspecified: Secondary | ICD-10-CM | POA: Diagnosis not present

## 2022-08-24 DIAGNOSIS — E1165 Type 2 diabetes mellitus with hyperglycemia: Secondary | ICD-10-CM | POA: Diagnosis not present

## 2022-08-24 DIAGNOSIS — Z0000 Encounter for general adult medical examination without abnormal findings: Secondary | ICD-10-CM | POA: Diagnosis not present

## 2022-08-24 DIAGNOSIS — L308 Other specified dermatitis: Secondary | ICD-10-CM | POA: Diagnosis not present

## 2022-08-24 DIAGNOSIS — E1122 Type 2 diabetes mellitus with diabetic chronic kidney disease: Secondary | ICD-10-CM | POA: Diagnosis not present

## 2022-08-24 DIAGNOSIS — E1121 Type 2 diabetes mellitus with diabetic nephropathy: Secondary | ICD-10-CM | POA: Diagnosis not present

## 2022-08-24 DIAGNOSIS — I130 Hypertensive heart and chronic kidney disease with heart failure and stage 1 through stage 4 chronic kidney disease, or unspecified chronic kidney disease: Secondary | ICD-10-CM | POA: Diagnosis not present

## 2022-08-24 DIAGNOSIS — N183 Chronic kidney disease, stage 3 unspecified: Secondary | ICD-10-CM | POA: Diagnosis not present

## 2022-08-24 DIAGNOSIS — Z13820 Encounter for screening for osteoporosis: Secondary | ICD-10-CM | POA: Diagnosis not present

## 2022-08-24 DIAGNOSIS — D649 Anemia, unspecified: Secondary | ICD-10-CM | POA: Diagnosis not present

## 2022-08-24 DIAGNOSIS — I5030 Unspecified diastolic (congestive) heart failure: Secondary | ICD-10-CM | POA: Diagnosis not present

## 2022-08-25 ENCOUNTER — Other Ambulatory Visit: Payer: Self-pay | Admitting: Internal Medicine

## 2022-08-25 DIAGNOSIS — E2839 Other primary ovarian failure: Secondary | ICD-10-CM

## 2022-08-25 DIAGNOSIS — M810 Age-related osteoporosis without current pathological fracture: Secondary | ICD-10-CM

## 2022-11-03 ENCOUNTER — Other Ambulatory Visit: Payer: Self-pay | Admitting: Internal Medicine

## 2022-11-03 DIAGNOSIS — Z1231 Encounter for screening mammogram for malignant neoplasm of breast: Secondary | ICD-10-CM

## 2022-11-23 DIAGNOSIS — H40053 Ocular hypertension, bilateral: Secondary | ICD-10-CM | POA: Diagnosis not present

## 2022-11-23 DIAGNOSIS — H2513 Age-related nuclear cataract, bilateral: Secondary | ICD-10-CM | POA: Diagnosis not present

## 2022-11-23 DIAGNOSIS — E119 Type 2 diabetes mellitus without complications: Secondary | ICD-10-CM | POA: Diagnosis not present

## 2022-11-23 DIAGNOSIS — H5203 Hypermetropia, bilateral: Secondary | ICD-10-CM | POA: Diagnosis not present

## 2022-12-08 ENCOUNTER — Ambulatory Visit
Admission: RE | Admit: 2022-12-08 | Discharge: 2022-12-08 | Disposition: A | Discharge status: Home / Self Care | Payer: Medicare Other | Source: Ambulatory Visit | Attending: Internal Medicine | Admitting: Internal Medicine

## 2022-12-08 DIAGNOSIS — Z1231 Encounter for screening mammogram for malignant neoplasm of breast: Secondary | ICD-10-CM | POA: Diagnosis not present

## 2023-01-27 ENCOUNTER — Inpatient Hospital Stay: Admission: RE | Admit: 2023-01-27 | Payer: Medicare Other | Source: Ambulatory Visit

## 2023-02-10 ENCOUNTER — Other Ambulatory Visit: Payer: Self-pay | Admitting: Internal Medicine

## 2023-02-10 DIAGNOSIS — M810 Age-related osteoporosis without current pathological fracture: Secondary | ICD-10-CM

## 2023-02-10 DIAGNOSIS — E2839 Other primary ovarian failure: Secondary | ICD-10-CM

## 2023-03-15 DIAGNOSIS — N183 Chronic kidney disease, stage 3 unspecified: Secondary | ICD-10-CM | POA: Diagnosis not present

## 2023-03-15 DIAGNOSIS — E1165 Type 2 diabetes mellitus with hyperglycemia: Secondary | ICD-10-CM | POA: Diagnosis not present

## 2023-03-15 DIAGNOSIS — I129 Hypertensive chronic kidney disease with stage 1 through stage 4 chronic kidney disease, or unspecified chronic kidney disease: Secondary | ICD-10-CM | POA: Diagnosis not present

## 2023-03-15 DIAGNOSIS — E7800 Pure hypercholesterolemia, unspecified: Secondary | ICD-10-CM | POA: Diagnosis not present

## 2023-03-15 DIAGNOSIS — I5032 Chronic diastolic (congestive) heart failure: Secondary | ICD-10-CM | POA: Diagnosis not present

## 2023-03-15 DIAGNOSIS — E1121 Type 2 diabetes mellitus with diabetic nephropathy: Secondary | ICD-10-CM | POA: Diagnosis not present

## 2023-03-15 DIAGNOSIS — I130 Hypertensive heart and chronic kidney disease with heart failure and stage 1 through stage 4 chronic kidney disease, or unspecified chronic kidney disease: Secondary | ICD-10-CM | POA: Diagnosis not present

## 2023-03-15 DIAGNOSIS — E1122 Type 2 diabetes mellitus with diabetic chronic kidney disease: Secondary | ICD-10-CM | POA: Diagnosis not present

## 2023-06-13 DIAGNOSIS — H25813 Combined forms of age-related cataract, bilateral: Secondary | ICD-10-CM | POA: Diagnosis not present

## 2023-06-13 DIAGNOSIS — H40013 Open angle with borderline findings, low risk, bilateral: Secondary | ICD-10-CM | POA: Diagnosis not present

## 2023-08-22 DIAGNOSIS — E1165 Type 2 diabetes mellitus with hyperglycemia: Secondary | ICD-10-CM | POA: Diagnosis not present

## 2023-08-22 DIAGNOSIS — E1121 Type 2 diabetes mellitus with diabetic nephropathy: Secondary | ICD-10-CM | POA: Diagnosis not present

## 2023-08-22 DIAGNOSIS — Z0000 Encounter for general adult medical examination without abnormal findings: Secondary | ICD-10-CM | POA: Diagnosis not present

## 2023-08-22 DIAGNOSIS — D649 Anemia, unspecified: Secondary | ICD-10-CM | POA: Diagnosis not present

## 2023-08-22 DIAGNOSIS — E7800 Pure hypercholesterolemia, unspecified: Secondary | ICD-10-CM | POA: Diagnosis not present

## 2023-08-22 DIAGNOSIS — N183 Chronic kidney disease, stage 3 unspecified: Secondary | ICD-10-CM | POA: Diagnosis not present

## 2023-08-22 DIAGNOSIS — L308 Other specified dermatitis: Secondary | ICD-10-CM | POA: Diagnosis not present

## 2023-08-22 DIAGNOSIS — I5030 Unspecified diastolic (congestive) heart failure: Secondary | ICD-10-CM | POA: Diagnosis not present

## 2023-08-22 DIAGNOSIS — I129 Hypertensive chronic kidney disease with stage 1 through stage 4 chronic kidney disease, or unspecified chronic kidney disease: Secondary | ICD-10-CM | POA: Diagnosis not present

## 2023-08-29 ENCOUNTER — Ambulatory Visit
Admission: RE | Admit: 2023-08-29 | Discharge: 2023-08-29 | Disposition: A | Discharge status: Home / Self Care | Payer: Medicare Other | Source: Ambulatory Visit | Attending: Internal Medicine | Admitting: Internal Medicine

## 2023-08-29 DIAGNOSIS — E2839 Other primary ovarian failure: Secondary | ICD-10-CM

## 2023-08-29 DIAGNOSIS — M810 Age-related osteoporosis without current pathological fracture: Secondary | ICD-10-CM

## 2023-11-15 ENCOUNTER — Other Ambulatory Visit: Payer: Self-pay | Admitting: Internal Medicine

## 2023-11-15 DIAGNOSIS — Z1231 Encounter for screening mammogram for malignant neoplasm of breast: Secondary | ICD-10-CM

## 2023-12-11 ENCOUNTER — Ambulatory Visit
Admission: RE | Admit: 2023-12-11 | Discharge: 2023-12-11 | Disposition: A | Discharge status: Home / Self Care | Source: Ambulatory Visit | Attending: Internal Medicine

## 2023-12-11 DIAGNOSIS — Z1231 Encounter for screening mammogram for malignant neoplasm of breast: Secondary | ICD-10-CM | POA: Diagnosis not present

## 2023-12-12 DIAGNOSIS — E119 Type 2 diabetes mellitus without complications: Secondary | ICD-10-CM | POA: Diagnosis not present

## 2023-12-12 DIAGNOSIS — H2513 Age-related nuclear cataract, bilateral: Secondary | ICD-10-CM | POA: Diagnosis not present

## 2023-12-12 DIAGNOSIS — H40013 Open angle with borderline findings, low risk, bilateral: Secondary | ICD-10-CM | POA: Diagnosis not present

## 2024-02-28 DIAGNOSIS — E7800 Pure hypercholesterolemia, unspecified: Secondary | ICD-10-CM | POA: Diagnosis not present

## 2024-02-28 DIAGNOSIS — I129 Hypertensive chronic kidney disease with stage 1 through stage 4 chronic kidney disease, or unspecified chronic kidney disease: Secondary | ICD-10-CM | POA: Diagnosis not present

## 2024-02-28 DIAGNOSIS — G5602 Carpal tunnel syndrome, left upper limb: Secondary | ICD-10-CM | POA: Diagnosis not present

## 2024-02-28 DIAGNOSIS — M25552 Pain in left hip: Secondary | ICD-10-CM | POA: Diagnosis not present

## 2024-02-28 DIAGNOSIS — E1121 Type 2 diabetes mellitus with diabetic nephropathy: Secondary | ICD-10-CM | POA: Diagnosis not present

## 2024-02-28 DIAGNOSIS — N183 Chronic kidney disease, stage 3 unspecified: Secondary | ICD-10-CM | POA: Diagnosis not present

## 2024-02-28 DIAGNOSIS — D649 Anemia, unspecified: Secondary | ICD-10-CM | POA: Diagnosis not present

## 2024-02-28 DIAGNOSIS — L308 Other specified dermatitis: Secondary | ICD-10-CM | POA: Diagnosis not present

## 2024-02-28 DIAGNOSIS — E1165 Type 2 diabetes mellitus with hyperglycemia: Secondary | ICD-10-CM | POA: Diagnosis not present

## 2024-02-28 DIAGNOSIS — I5030 Unspecified diastolic (congestive) heart failure: Secondary | ICD-10-CM | POA: Diagnosis not present

## 2024-03-17 ENCOUNTER — Encounter (HOSPITAL_BASED_OUTPATIENT_CLINIC_OR_DEPARTMENT_OTHER): Payer: Self-pay | Admitting: Emergency Medicine

## 2024-03-17 ENCOUNTER — Other Ambulatory Visit: Payer: Self-pay

## 2024-03-17 ENCOUNTER — Emergency Department (HOSPITAL_BASED_OUTPATIENT_CLINIC_OR_DEPARTMENT_OTHER)
Admission: EM | Admit: 2024-03-17 | Discharge: 2024-03-17 | Disposition: A | Discharge status: Home / Self Care | Attending: Emergency Medicine | Admitting: Emergency Medicine

## 2024-03-17 DIAGNOSIS — Z7984 Long term (current) use of oral hypoglycemic drugs: Secondary | ICD-10-CM | POA: Insufficient documentation

## 2024-03-17 DIAGNOSIS — E119 Type 2 diabetes mellitus without complications: Secondary | ICD-10-CM | POA: Diagnosis not present

## 2024-03-17 DIAGNOSIS — T63481A Toxic effect of venom of other arthropod, accidental (unintentional), initial encounter: Secondary | ICD-10-CM | POA: Diagnosis not present

## 2024-03-17 DIAGNOSIS — S60562A Insect bite (nonvenomous) of left hand, initial encounter: Secondary | ICD-10-CM | POA: Diagnosis not present

## 2024-03-17 DIAGNOSIS — M7989 Other specified soft tissue disorders: Secondary | ICD-10-CM | POA: Diagnosis present

## 2024-03-17 DIAGNOSIS — I10 Essential (primary) hypertension: Secondary | ICD-10-CM | POA: Insufficient documentation

## 2024-03-17 DIAGNOSIS — T63441A Toxic effect of venom of bees, accidental (unintentional), initial encounter: Secondary | ICD-10-CM | POA: Diagnosis not present

## 2024-03-17 DIAGNOSIS — Z79899 Other long term (current) drug therapy: Secondary | ICD-10-CM | POA: Diagnosis not present

## 2024-03-17 MED ORDER — CEPHALEXIN 500 MG PO CAPS: 500.0000 mg | ORAL_CAPSULE | Freq: Four times a day (QID) | ORAL | 0 refills | Status: AC

## 2024-03-17 MED ORDER — PREDNISONE 20 MG PO TABS
40.0000 mg | ORAL_TABLET | Freq: Once | ORAL | Status: AC
  Administered 2024-03-17: 40 mg via ORAL
  Filled 2024-03-17: qty 2

## 2024-03-17 MED ORDER — CEPHALEXIN 250 MG PO CAPS
500.0000 mg | ORAL_CAPSULE | Freq: Once | ORAL | Status: AC
  Administered 2024-03-17: 500 mg via ORAL
  Filled 2024-03-17: qty 2

## 2024-03-17 MED ORDER — PREDNISONE 10 MG PO TABS: 20.0000 mg | ORAL_TABLET | Freq: Two times a day (BID) | ORAL | 0 refills | Status: AC

## 2024-03-17 NOTE — ED Triage Notes (Signed)
 Reports bee sting on left hand on Friday. Swelling to area.

## 2024-03-17 NOTE — ED Notes (Signed)
 Pt has swelling to L hand/wrist starting this AM. Pt reports being stung by wasp x2 days ago. Pt as good distal PMS in LUE.

## 2024-03-17 NOTE — ED Provider Notes (Signed)
  [place] EMERGENCY DEPARTMENT AT [facility] Provider Note   CSN: [id number] Arrival date & time: 03/17/24  9176     Patient presents with: Insect Bite   Nicole Shields is a 73 y.o. female.   Patient is a 73 year old female with history of type 2 diabetes, hypertension, hyperlipidemia.  Patient presenting today with complaints of left hand and wrist swelling.  She was stung by a wasp 2 days ago.  She began swelling yesterday.  She now reports pain, swelling, and tightness to her left hand extending beyond the wrist into the distal forearm.  She denies any fevers or chills.  She has not taken anything at home.       Prior to Admission medications   Medication Sig Start Date End Date Taking? Authorizing Provider  atenolol (TENORMIN) 25 MG tablet Take by mouth daily.    [provider]  metFORMIN (GLUCOPHAGE) 500 MG tablet Take by mouth 2 (two) times daily with a meal.    [provider]  potassium bicarbonate (K-LYTE) 25 MEQ disintegrating tablet Take 25 mEq by mouth 2 (two) times daily.    [provider]  simvastatin (ZOCOR) 20 MG tablet Take 20 mg by mouth daily.    [provider]    Allergies: Patient has no allergy information on record.    Review of Systems  All other systems reviewed and are negative.   Updated Vital Signs BP (!) 145/72 (BP Location: Right Arm)   Pulse 70   Temp 98.7 F (37.1 C) (Oral)   Resp 18   SpO2 100%   Physical Exam Vitals and nursing note reviewed.  Constitutional:      Appearance: Normal appearance.  Pulmonary:     Effort: Pulmonary effort is normal.  Musculoskeletal:     Comments: The left hand has generalized swelling extending into the distal forearm.  She can flex and extend the fingers, but with some discomfort.  Ulnar radial pulses are palpable and capillary refill is brisk.  Skin:    General: Skin is warm and dry.  Neurological:     Mental Status: She is alert and oriented to  person, place, and time.     (all labs ordered are listed, but only abnormal results are displayed) Labs Reviewed - No data to display  EKG: None  Radiology: No results found.   Procedures   Medications Ordered in the ED - No data to display                                  Medical Decision Making  Patient presenting with hand and wrist swelling after being stung by a wasp 2 days ago.  Unclear as to whether this represents a local reaction or cellulitis, so plan will be to treat both ways.  I will prescribe Keflex and prednisone and have her take Benadryl.  To return as needed for any problems.     Final diagnoses:  None    ED Discharge Orders     None          Geroldine Berg, MD 03/17/24 [phone]

## 2024-03-17 NOTE — Discharge Instructions (Signed)
 Begin taking prednisone and Keflex as prescribed.  Take Benadryl 25 mg every 6 hours as needed for itching.  Return to the ER if symptoms significantly worsen or change.
